# Patient Record
Sex: Female | Born: 1970 | Race: Black or African American | Hispanic: No | Marital: Single | Smoking: Never smoker
Health system: Southern US, Community
[De-identification: ages and names within clinical notes are randomized; demographics above are authoritative.]

## PROBLEM LIST (undated history)

## (undated) DIAGNOSIS — D869 Sarcoidosis, unspecified: Secondary | ICD-10-CM

## (undated) DIAGNOSIS — K219 Gastro-esophageal reflux disease without esophagitis: Secondary | ICD-10-CM

## (undated) DIAGNOSIS — R0600 Dyspnea, unspecified: Secondary | ICD-10-CM

## (undated) DIAGNOSIS — K30 Functional dyspepsia: Secondary | ICD-10-CM

## (undated) DIAGNOSIS — I00 Rheumatic fever without heart involvement: Secondary | ICD-10-CM

## (undated) HISTORY — PX: OTHER SURGICAL HISTORY: SHX169

## (undated) HISTORY — DX: Dyspnea, unspecified: R06.00

## (undated) HISTORY — DX: Functional dyspepsia: K30

## (undated) HISTORY — DX: Rheumatic fever without heart involvement: I00

## (undated) HISTORY — DX: Gastro-esophageal reflux disease without esophagitis: K21.9

## (undated) HISTORY — DX: Sarcoidosis, unspecified: D86.9

---

## 2012-05-01 LAB — HM COLONOSCOPY

## 2013-03-22 ENCOUNTER — Ambulatory Visit (INDEPENDENT_AMBULATORY_CARE_PROVIDER_SITE_OTHER): Payer: Commercial Managed Care - PPO | Admitting: Neurology

## 2013-03-22 ENCOUNTER — Encounter (INDEPENDENT_AMBULATORY_CARE_PROVIDER_SITE_OTHER): Payer: Self-pay

## 2013-03-22 ENCOUNTER — Encounter: Payer: Self-pay | Admitting: Neurology

## 2013-03-22 VITALS — BP 130/84 | HR 74 | Ht 67.0 in | Wt 188.0 lb

## 2013-03-22 DIAGNOSIS — R202 Paresthesia of skin: Secondary | ICD-10-CM | POA: Insufficient documentation

## 2013-03-22 DIAGNOSIS — R209 Unspecified disturbances of skin sensation: Secondary | ICD-10-CM

## 2013-03-22 DIAGNOSIS — D869 Sarcoidosis, unspecified: Secondary | ICD-10-CM | POA: Insufficient documentation

## 2013-03-22 NOTE — Progress Notes (Signed)
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, she had excessive weight gain, also complains of fatigue easily then     She was evaluated with MRI of lumbar, MRI of the pelvic, that was reported normal  Her symptoms persisted, she was evaluated by neurologist around 2013, reported normal EMG nerve conduction study,  Over the past few years, she also noticed her left leg seems to give out on her easily during her workout, mild weakness in her left arm as well  She denies significant low back pain, no gait difficulty, no right leg symptoms, no visual loss, no bilateral arm paresthesia weakness  Most recent laboratory evaluation showed normal CBC, TSH, CMP  REVIEW OF SYSTEMS: Full 14 system review of systems performed and notable only for left leg discomfort   ALLERGIES: No Known Allergies  HOME MEDICATIONS:  PAST MEDICAL HISTORY: History reviewed. No pertinent past medical history.  PAST SURGICAL HISTORY: Past Surgical History  Procedure Laterality Date  . None listed      FAMILY HISTORY: Family History  Problem Relation Age of Onset  . High blood pressure Father     SOCIAL HISTORY:  History   Social History  . Marital Status: Single    Spouse Name: N/A    Number of Children: 0  . 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 Smoking status: Never Smoker   . Smokeless tobacco: Never  Used  . Alcohol Use: No  . Drug Use: No  . Sexual Activity: Not on file   Other Topics Concern  . Not on file   Social History Narrative   Patient lives at home alone and she is single. Patient works full time for [employer].   Education college   Right handed   Caffeine one cup of coffee daily.     PHYSICAL EXAM   Filed Vitals:   03/22/13 0846  BP: 130/84  Pulse: 74  Height: 5\' 7"  (1.702 m)  Weight: 188 lb (85.276 kg)    Body mass index is 29.44 kg/(m^2).   Generalized: In no acute distress  Neck: Supple, no carotid bruits   Cardiac: Regular rate rhythm  Pulmonary: Clear to auscultation bilaterally  Musculoskeletal: No deformity  Neurological examination  Mentation: Alert oriented to time, place, history taking, and causual conversation  Cranial nerve II-XII: Pupils were equal round reactive to light. Extraocular movements were full.  Visual field were full on confrontational test. Bilateral fundi were sharp.  Facial sensation and strength were normal. Hearing was intact to finger rubbing bilaterally. Uvula tongue midline.  Head turning and shoulder shrug and were normal and symmetric.Tongue protrusion into cheek strength was normal.  Motor: Normal tone, bulk and strength.  Sensory: Intact to fine touch, pinprick, preserved vibratory sensation, and proprioception at toes.  Coordination: Normal finger to nose, heel-to-shin bilaterally there was no truncal ataxia  Gait: Rising up from seated position without assistance, normal stance, without  trunk ataxia, moderate stride, good arm swing, smooth turning, able to perform tiptoe, and heel walking without difficulty.   Romberg signs: Negative  Deep tendon reflexes: Brachioradialis 2/2, biceps 2/2, triceps 2/2, patellar 2/2, Achilles 2/2, plantar responses were flexor bilaterally.   DIAGNOSTIC DATA (LABS, IMAGING, TESTING) - I reviewed patient records, labs, notes, testing and imaging myself where  available.  ASSESSMENT AND PLAN  Babette Relicammy Leavy CellaBoyd is a 43 y.o. female complains of left leg discomfort, involving her left anterior thigh, extending below her left knee and left medial leg, essentially normal neurological examination, previous normal evaluation including EMG nerve conduction study, MRI of lumbar, MRI of pelvic,  1. potential localization of her complains also includes left cervical spine, vs. right hemisphere 2. MRI of cervical, brain with without contrast with her history of pulmonary sarcoidosis 3 I will call her report.          Levert FeinsteinYijun Darice Vicario, M.D. Ph.D.  [facility] [address], [address] [place], [place] [id number] [phone]

## 2013-03-29 ENCOUNTER — Other Ambulatory Visit: Payer: Commercial Managed Care - PPO

## 2013-04-19 ENCOUNTER — Other Ambulatory Visit: Payer: Commercial Managed Care - PPO

## 2013-05-10 ENCOUNTER — Encounter (INDEPENDENT_AMBULATORY_CARE_PROVIDER_SITE_OTHER): Payer: Self-pay

## 2013-05-10 ENCOUNTER — Ambulatory Visit (INDEPENDENT_AMBULATORY_CARE_PROVIDER_SITE_OTHER): Payer: Commercial Managed Care - PPO

## 2013-05-10 DIAGNOSIS — R202 Paresthesia of skin: Secondary | ICD-10-CM

## 2013-05-10 DIAGNOSIS — R209 Unspecified disturbances of skin sensation: Secondary | ICD-10-CM

## 2013-05-10 MED ORDER — GADOPENTETATE DIMEGLUMINE 469.01 MG/ML IV SOLN: 17.0000 mL | Freq: Once | INTRAVENOUS | Status: AC | PRN

## 2013-05-17 NOTE — Progress Notes (Signed)
Quick Note:  Spoke to patient and relayed normal MRI brain, and MRI cervical results, per Dr. Hosie PoissonSumner. Patient had difficulty getting on my chart and will contact [facility]. ______

## 2013-08-30 ENCOUNTER — Ambulatory Visit
Admission: RE | Admit: 2013-08-30 | Discharge: 2013-08-30 | Disposition: A | Discharge status: Home / Self Care | Payer: Commercial Managed Care - PPO | Source: Ambulatory Visit | Attending: Cardiology | Admitting: Cardiology

## 2013-08-30 ENCOUNTER — Other Ambulatory Visit: Payer: Self-pay | Admitting: Cardiology

## 2013-08-30 DIAGNOSIS — D869 Sarcoidosis, unspecified: Secondary | ICD-10-CM

## 2013-09-27 ENCOUNTER — Encounter (INDEPENDENT_AMBULATORY_CARE_PROVIDER_SITE_OTHER): Payer: Self-pay

## 2013-09-27 ENCOUNTER — Ambulatory Visit (INDEPENDENT_AMBULATORY_CARE_PROVIDER_SITE_OTHER): Payer: Commercial Managed Care - PPO | Admitting: Internal Medicine

## 2013-09-27 ENCOUNTER — Encounter: Payer: Self-pay | Admitting: Internal Medicine

## 2013-09-27 VITALS — BP 116/72 | HR 89 | Ht 67.5 in | Wt 181.0 lb

## 2013-09-27 DIAGNOSIS — R000 Tachycardia, unspecified: Secondary | ICD-10-CM

## 2013-09-27 DIAGNOSIS — D869 Sarcoidosis, unspecified: Secondary | ICD-10-CM

## 2013-09-27 NOTE — Progress Notes (Signed)
Subjective:    Patient ID: Carol French, female    DOB: 1970/06/18, 43 y.o.   MRN: [id number] PCP Gaye Alken, MD  HPI   IOV 09/27/2013  Chief Complaint  Patient presents with  . Pulmonary Consult    Referred by Dr. Nadara Eaton for dyspnea. Pt c/o dyspnea with activity and rest. Pt states she has PND and only clears throat d/t PND. Pt denies CP/tightness.     43 year old [place] American female. 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 Referred for evaluation of sarcoidosis.  Reports that in 2007 she was having chronic cough which then resulted in a diagnosis of pulmonary sarcoidosis early 2008 based on CT scan of the chest and bronchoscopy in [place]. She was then evaluated by pulmonologist to her history. She was placed on chronic prednisone dose unknown but she continued for a few years up until 2010/2011. Since then she has been doing relatively coffee particularly in the last one or 2 years she has been completely without cough and has not followed up with a pulmonologist. She is also normal residence in [place], [place].    In the interim, she does note that for several years she's had insidious onset of shortness of breath that happened at random. These episodes of a subjective sensation of dyspnea while watching TV that persists transiently and then resolves. She does not think much of this and on as he does not want to have a detailed evaluation for the same. She thinks this might be due to thickness tissue. There is no associated cough no wheezing, orthopnea, paroxysmal nocturnal dyspnea, edema.  Chest x-ray in mid July 2015 shows hyperinflation with chronic upper lobe pulmonary fibrosis all suggestive of burnt out sarcoidosis  The main new problem is one of tachycardia that started sometime around May 2015. She's had random episodes of tachycardia with heart rate of 150 per minute. There are related to caffeine intake. Then on her birthday on  08/26/2011 the tachycardia persisted all day and since then she's not had any tachycardia. Episodes can last for a few seconds to a few minutes. When she exercises she does not feel dyspnea or tachycardia. She was elevated by cardiologist. A echo report is attached and this is normal on 09/01/2013. She's had a Holter study done and apparently this showed some kind of a sinus arrhythmia. She is unsure of the details. She has a followup pending with her cardiologist Dr. Docia Barrier. But apparently cardiac sarcoidosis has been ruled out according to the history   Past Medical History  Diagnosis Date  . Sarcoidosis   . Dyspnea   . Indigestion      Family History  Problem Relation Age of Onset  . High blood pressure Father      History   Social History  . Marital Status: Unknown    Spouse Name: N/A    Number of Children: 0  . Years of Education: college   Occupational History  . 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 Smoking status: Never Smoker   . Smokeless tobacco: Never Used  . Alcohol Use: No  . Drug Use: No  . Sexual Activity: Not on file   Other Topics Concern  . Not on file   Social History Narrative   Patient lives at home alone and she is single. Patient works full time for [employer].   Education college   Right handed   Caffeine  one cup of coffee daily.     No Known Allergies   Outpatient Prescriptions Prior to Visit  Medication Sig Dispense Refill  . Omeprazole-Sodium Bicarbonate (ZEGERID PO) Take by mouth daily.       No facility-administered medications prior to visit.      Review of Systems  Constitutional: Negative for fever and unexpected weight change.  HENT: Positive for congestion and postnasal drip. Negative for dental problem, ear pain, nosebleeds, rhinorrhea, sinus pressure, sneezing, sore throat and trouble swallowing.   Eyes: Negative for redness and itching.  Respiratory: Positive for cough and shortness  of breath. Negative for chest tightness and wheezing.   Cardiovascular: Positive for palpitations. Negative for leg swelling.  Gastrointestinal: Negative for nausea and vomiting.  Genitourinary: Negative for dysuria.  Musculoskeletal: Negative for joint swelling.  Skin: Negative for rash.  Neurological: Negative for headaches.  Hematological: Does not bruise/bleed easily.  Psychiatric/Behavioral: Negative for dysphoric mood. The patient is not nervous/anxious.    Current outpatient prescriptions:Multiple Vitamin (MULTI-VITAMINS) TABS, Take 1 tablet by mouth daily., Disp: , Rfl: ;  Omeprazole-Sodium Bicarbonate (ZEGERID PO), Take by mouth daily., Disp: , Rfl:       Objective:   Physical Exam  Vitals reviewed. Constitutional: She is oriented to person, place, and time. She appears well-developed and well-nourished. No distress.  HENT:  Head: Normocephalic and atraumatic.  Right Ear: External ear normal.  Left Ear: External ear normal.  Mouth/Throat: Oropharynx is clear and moist. No oropharyngeal exudate.  Eyes: Conjunctivae and EOM are normal. Pupils are equal, round, and reactive to light. Right eye exhibits no discharge. Left eye exhibits no discharge. No scleral icterus.  Neck: Normal range of motion. Neck supple. No JVD present. No tracheal deviation present. No thyromegaly present.  Cardiovascular: Normal rate, regular rhythm, normal heart sounds and intact distal pulses.  Exam reveals no gallop and no friction rub.   No murmur heard. Pulmonary/Chest: Effort normal and breath sounds normal. No respiratory distress. She has no wheezes. She has no rales. She exhibits no tenderness.  Abdominal: Soft. Bowel sounds are normal. She exhibits no distension and no mass. There is no tenderness. There is no rebound and no guarding.  Musculoskeletal: Normal range of motion. She exhibits no edema and no tenderness.  Lymphadenopathy:    She has no cervical adenopathy.  Neurological: She is  alert and oriented to person, place, and time. She has normal reflexes. No cranial nerve deficit. She exhibits normal muscle tone. Coordination normal.  Skin: Skin is warm and dry. No rash noted. She is not diaphoretic. No erythema. No pallor.  Psychiatric: She has a normal mood and affect. Her behavior is normal. Judgment and thought content normal.   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 Only  Dr Leanna Battles or Dr. Trudie Reed to read)  - Please do PFT - If these are ok, given asymptomatic state observation treatment only  #Tachycardia  - Please discuss with Dr Jacinto Halim and ensure no evidence of cardiac sarcoidosis - IF any hint of cardiac sarcoid, AICD placement is recommended along with steroids  #[place] Will call you  with test results to ensure course of action

## 2013-09-27 NOTE — Patient Instructions (Addendum)
#  Pulmonary sarcoidosis  - likely burnt out  - asess current baseline:    - Please do Ct chest without contrast (  High Resolution CT chest without contrast on ILD protocol. Only  Dr Leanna Battles or Dr. Trudie Reed to read)  - Please do PFT - If these are ok, given asymptomatic state observation treatment only  #Tachycardia  - Please discuss with Dr Jacinto Halim and ensure no evidence of cardiac sarcoidosis - IF any hint of cardiac sarcoid, AICD placement is recommended along with steroids  #[place] Will call you with test results to ensure course of action

## 2013-09-29 NOTE — Assessment & Plan Note (Signed)
#  Pulmonary sarcoidosis  - likely burnt out  - asess current baseline:    - Please do Ct chest without contrast (  High Resolution CT chest without contrast on ILD protocol. Only  Dr Leanna Battles or Dr. Trudie Reed to read)  - Please do PFT - If these are ok, given asymptomatic state observation treatment only   #[place] Will call you with test results to ensure course of action

## 2013-09-29 NOTE — Assessment & Plan Note (Signed)
#  Tachycardia  - Please discuss with Dr Jacinto Halim and ensure no evidence of cardiac sarcoidosis - IF any hint of cardiac sarcoid, AICD placement is recommended along with steroids

## 2013-10-11 ENCOUNTER — Ambulatory Visit (INDEPENDENT_AMBULATORY_CARE_PROVIDER_SITE_OTHER)
Admission: RE | Admit: 2013-10-11 | Discharge: 2013-10-11 | Disposition: A | Discharge status: Home / Self Care | Payer: Commercial Managed Care - PPO | Source: Ambulatory Visit | Attending: Internal Medicine | Admitting: Internal Medicine

## 2013-10-11 ENCOUNTER — Telehealth: Payer: Self-pay | Admitting: Internal Medicine

## 2013-10-11 DIAGNOSIS — D869 Sarcoidosis, unspecified: Secondary | ICD-10-CM

## 2013-10-11 NOTE — Telephone Encounter (Signed)
  CGT just shows burned out sarcoid on the apex of lung. Will await PFT results 9/11/5. I left messsage saying that I will call her next week once PFT done but she can call 10/11/2013 if she wanted   Ct Chest High Resolution  10/11/2013   CLINICAL DATA:  Sarcoidosis.  EXAM: CT CHEST WITHOUT CONTRAST  TECHNIQUE: Multidetector CT imaging of the chest was performed following the standard protocol without intravenous contrast. High resolution imaging of the lungs, as well as inspiratory and expiratory imaging, was performed.  COMPARISON:  No prior chest CTs.  Chest x-ray 08/30/2013.  FINDINGS: Mediastinum: Heart size is normal. There is no significant pericardial fluid, thickening or pericardial calcification. No pathologically enlarged mediastinal or hilar lymph nodes. Please note that accurate exclusion of hilar adenopathy is limited on noncontrast CT scans. Esophagus is unremarkable in appearance.  Lungs/Pleura: There is an upper lung predominant pattern of cylindrical and varicose bronchiectasis with associated architectural distortion and volume loss which results and upward retraction of hilar structures. Diffuse thickening of the peribronchovascular interstitium is noted throughout the lungs bilaterally, again upper lung predominant. Mild diffuse peribronchovascular and subpleural nodularity (i.e., a perilymphatic distribution) is noted. No larger more suspicious appearing pulmonary nodules or masses. No acute consolidative airspace disease. No pleural effusions. Inspiratory and expiratory imaging is unremarkable.  Upper Abdomen: Unremarkable.  Musculoskeletal: There are no aggressive appearing lytic or blastic lesions noted in the visualized portions of the skeleton.  IMPRESSION: 1. Upper lung predominant perilymphatic distribution of chronic lung changes with some associated upper lung predominant cylindrical and varicose bronchiectasis, as discussed above, is compatible with the reported clinical history of  sarcoidosis. 2. No mediastinal or hilar lymphadenopathy noted at this time. 3. No acute findings.   Electronically Signed   By: Trudie Reed M.D.   On: 10/11/2013 09:31

## 2013-10-12 ENCOUNTER — Ambulatory Visit (INDEPENDENT_AMBULATORY_CARE_PROVIDER_SITE_OTHER): Payer: Commercial Managed Care - PPO | Admitting: Internal Medicine

## 2013-10-12 DIAGNOSIS — D869 Sarcoidosis, unspecified: Secondary | ICD-10-CM

## 2013-10-12 LAB — PULMONARY FUNCTION TEST
DL/VA % pred: 91 %
DL/VA: 4.73 ml/min/mmHg/L
DLCO unc % pred: 73 %
DLCO unc: 20.82 ml/min/mmHg
FEF 25-75 Post: 4.5 L/s
FEF 25-75 Pre: 4 L/s
FEF2575-%Change-Post: 12 %
FEF2575-%Pred-Post: 137 %
FEF2575-%Pred-Pre: 122 %
FEV1-%Change-Post: 1 %
FEV1-%Pred-Post: 99 %
FEV1-%Pred-Pre: 98 %
FEV1-Post: 3.29 L
FEV1-Pre: 3.23 L
FEV1FVC-%Change-Post: 1 %
FEV1FVC-%Pred-Pre: 109 %
FEV6-%Change-Post: 0 %
FEV6-%Pred-Post: 90 %
FEV6-%Pred-Pre: 89 %
FEV6-Post: 3.6 L
FEV6-Pre: 3.57 L
FEV6FVC-%Change-Post: 0 %
FEV6FVC-%Pred-Post: 102 %
FEV6FVC-%Pred-Pre: 101 %
FVC-%Change-Post: 0 %
FVC-%Pred-Post: 88 %
FVC-%Pred-Pre: 88 %
FVC-Post: 3.6 L
FVC-Pre: 3.58 L
Post FEV1/FVC ratio: 91 %
Post FEV6/FVC ratio: 100 %
Pre FEV1/FVC ratio: 90 %
Pre FEV6/FVC Ratio: 100 %
RV % pred: 67 %
RV: 1.19 L
TLC % pred: 83 %
TLC: 4.58 L

## 2013-10-12 NOTE — Progress Notes (Signed)
PFT done today. 

## 2013-10-16 NOTE — Telephone Encounter (Signed)
Called and spoke to pt. Informed pt of the results and recs per MR. Pt verbalize understanding and denied any further questions or concerns at this time.

## 2013-12-02 NOTE — Telephone Encounter (Signed)
Let her know PFT 10/12/13 is normal except mild reduction in diffusion which reflects the burnt out sarcoid. My apologies for getting back to her late. Please give fu in 1 year from 12/02/2013   Dr. Kalman ShanMurali Kaye Mitro, M.D., [facility]P Pulmonary and Critical Care Medicine Staff Physician [facility] Pulmonary and Critical Care Pager: [phone], If no answer or between  15:00h - 7:00h: call [phone]  12/02/2013 8:11 PM

## 2013-12-04 NOTE — Telephone Encounter (Signed)
lmtcb

## 2013-12-04 NOTE — Telephone Encounter (Signed)
Pt returned call. Informed pt of the results and recs per MR. 1 year recall placed. Pt verbalized understanding and denied any further questions or concerns at this time.

## 2015-01-21 ENCOUNTER — Other Ambulatory Visit: Payer: Self-pay | Admitting: Obstetrics and Gynecology

## 2015-01-21 DIAGNOSIS — R928 Other abnormal and inconclusive findings on diagnostic imaging of breast: Secondary | ICD-10-CM

## 2015-01-24 ENCOUNTER — Ambulatory Visit
Admission: RE | Admit: 2015-01-24 | Discharge: 2015-01-24 | Disposition: A | Discharge status: Home / Self Care | Payer: Commercial Managed Care - PPO | Source: Ambulatory Visit | Attending: Obstetrics and Gynecology | Admitting: Obstetrics and Gynecology

## 2015-01-24 DIAGNOSIS — R928 Other abnormal and inconclusive findings on diagnostic imaging of breast: Secondary | ICD-10-CM

## 2015-02-13 ENCOUNTER — Ambulatory Visit (INDEPENDENT_AMBULATORY_CARE_PROVIDER_SITE_OTHER): Payer: Commercial Managed Care - PPO | Admitting: Internal Medicine

## 2015-02-13 ENCOUNTER — Encounter: Payer: Self-pay | Admitting: Internal Medicine

## 2015-02-13 VITALS — BP 120/78 | HR 86 | Ht 67.5 in | Wt 185.0 lb

## 2015-02-13 DIAGNOSIS — D869 Sarcoidosis, unspecified: Secondary | ICD-10-CM | POA: Diagnosis not present

## 2015-02-13 NOTE — Patient Instructions (Signed)
ICD-9-CM ICD-10-CM   1. Sarcoidosis (HCC) 135 D86.9    Burn doubt sarcoidosis of the lung Glad you're doing well and asymptomatic Pay attention to renal ocular, pulmonary, skin issues as  life progresses  - Glad you seeing a dermatologist for a lip lesion history Return again have any concerns otherwise open-ended follow-up

## 2015-02-13 NOTE — Progress Notes (Signed)
Subjective:     Patient ID: Carol French, female   DOB: Aug 16, 1970, 45 y.o.   MRN: [id number]  HPI    IOV 09/27/2013  Chief Complaint  Patient presents with  . Pulmonary Consult    Referred by Dr. Nadara EatonGangi for dyspnea. Pt c/o dyspnea with activity and rest. Pt states she has PND and only clears throat d/t PND. Pt denies CP/tightness.     45 year old [place] female. 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 Referred for evaluation of sarcoidosis.  Reports that in 2007 she was having chronic cough which then resulted in a diagnosis of pulmonary sarcoidosis early 2008 based on CT scan of the chest and bronchoscopy in [place]. She was then evaluated by pulmonologist to her history. She was placed on chronic prednisone dose unknown but she continued for a few years up until 2010/2011. Since then she has been doing relatively coffee particularly in the last one or 2 years she has been completely without cough and has not followed up with a pulmonologist. She is also normal [place] in [place], [place].    In the interim, she does note that for several years she's had insidious onset of shortness of breath that happened at random. These episodes of a subjective sensation of dyspnea while watching TV that persists transiently and then resolves. She does not think much of this and on as he does not want to have a detailed evaluation for the same. She thinks this might be due to thickness tissue. There is no associated cough no wheezing, orthopnea, paroxysmal nocturnal dyspnea, edema.  Chest x-ray in mid July 2015 shows hyperinflation with chronic upper lobe pulmonary fibrosis all suggestive of burnt out sarcoidosis  The main new problem is one of tachycardia that started sometime around May 2015. She's had random episodes of tachycardia with heart rate of 150 per minute. There are related to caffeine intake. Then on her birthday on 08/26/2011 the tachycardia persisted  all day and since then she's not had any tachycardia. Episodes can last for a few seconds to a few minutes. When she exercises she does not feel dyspnea or tachycardia. She was elevated by cardiologist. A echo report is attached and this is normal on 09/01/2013. She's had a Holter study done and apparently this showed some kind of a sinus arrhythmia. She is unsure of the details. She has a followup pending with her cardiologist Dr. Docia BarrierGanje. But apparently cardiac sarcoidosis has been ruled out according to the history    OV 02/13/2015  Chief Complaint  Patient presents with  . Follow-up    Pt here for 1 year f/u. Pt states her breathing is doing well. Pt denies SOB, cough, CP/tightness.    Follow-up sarcoidosis.  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 It has been slightly over one year but this is an annual follow-up. We did a pulmonary function test which was normal other than isolated diffusion capacity reduction. CT chest in the end of 2015 showed burnt out sarcoid. There is some concern about tachycardia but patient says this is resolved. She is essentially asymptomatic. Recently she had some mild occasionally recurrent lip lesions and she is trying to see a dermatologist. Given the fact she is doing well she wants to have an open area follow-up plan with us. Review of Systems     Objective:   Physical Exam  Constitutional: She is oriented to person, place, and time. She appears well-developed and well-nourished. No distress.  HENT:  Head:  Normocephalic and atraumatic.  Right Ear: External ear normal.  Left Ear: External ear normal.  Mouth/Throat: Oropharynx is clear and moist. No oropharyngeal exudate.  Eyes: Conjunctivae and EOM are normal. Pupils are equal, round, and reactive to light. Right eye exhibits no discharge. Left eye exhibits no discharge. No scleral icterus.  Neck: Normal range of motion. Neck supple. No JVD present. No tracheal deviation present. No thyromegaly present.   Cardiovascular: Normal rate, regular rhythm, normal heart sounds and intact distal pulses.  Exam reveals no gallop and no friction rub.   No murmur heard. Pulmonary/Chest: Effort normal and breath sounds normal. No respiratory distress. She has no wheezes. She has no rales. She exhibits no tenderness.  Abdominal: Soft. Bowel sounds are normal. She exhibits no distension and no mass. There is no tenderness. There is no rebound and no guarding.  Musculoskeletal: Normal range of motion. She exhibits no edema or tenderness.  Lymphadenopathy:    She has no cervical adenopathy.  Neurological: She is alert and oriented to person, place, and time. She has normal reflexes. No cranial nerve deficit. She exhibits normal muscle tone. Coordination normal.  Skin: Skin is warm and dry. No rash noted. She is not diaphoretic. No erythema. No pallor.  Psychiatric: She has a normal mood and affect. Her behavior is normal. Judgment and thought content normal.  Vitals reviewed.   Filed Vitals:   02/13/15 1402  BP: 120/78  Pulse: 86  Height: 5' 7.5" (1.715 m)  Weight: 185 lb (83.915 kg)  SpO2: 99%        Assessment:       ICD-9-CM ICD-10-CM   1. Sarcoidosis (HCC) 135 D86.9       Plan:     Burn doubt sarcoidosis of the lung Glad you're doing well and asymptomatic Pay attention to renal ocular, pulmonary, skin issues as  life progresses  - Glad you seeing a dermatologist for a lip lesion history Return again have any concerns otherwise open-ended follow-up   Dr. Kalman Shan, M.D., [facility].C.P Pulmonary and Critical Care Medicine Staff Physician [facility] Pulmonary and Critical Care Pager: [phone], If no answer or between  15:00h - 7:00h: call [phone]  02/13/2015 2:12 PM

## 2015-06-23 ENCOUNTER — Other Ambulatory Visit: Payer: Self-pay | Admitting: Obstetrics and Gynecology

## 2015-06-23 DIAGNOSIS — N6489 Other specified disorders of breast: Secondary | ICD-10-CM

## 2015-07-31 ENCOUNTER — Ambulatory Visit
Admission: RE | Admit: 2015-07-31 | Discharge: 2015-07-31 | Disposition: A | Discharge status: Home / Self Care | Payer: Commercial Managed Care - PPO | Source: Ambulatory Visit | Attending: Obstetrics and Gynecology | Admitting: Obstetrics and Gynecology

## 2015-07-31 DIAGNOSIS — N6489 Other specified disorders of breast: Secondary | ICD-10-CM

## 2016-04-13 ENCOUNTER — Other Ambulatory Visit: Payer: Self-pay | Admitting: Obstetrics and Gynecology

## 2016-04-13 DIAGNOSIS — N6489 Other specified disorders of breast: Secondary | ICD-10-CM

## 2016-04-22 ENCOUNTER — Ambulatory Visit
Admission: RE | Admit: 2016-04-22 | Discharge: 2016-04-22 | Disposition: A | Discharge status: Home / Self Care | Payer: Commercial Managed Care - PPO | Source: Ambulatory Visit | Attending: Obstetrics and Gynecology | Admitting: Obstetrics and Gynecology

## 2016-04-22 DIAGNOSIS — N6489 Other specified disorders of breast: Secondary | ICD-10-CM

## 2017-06-20 ENCOUNTER — Other Ambulatory Visit: Payer: Self-pay | Admitting: Obstetrics and Gynecology

## 2017-06-20 DIAGNOSIS — Z1231 Encounter for screening mammogram for malignant neoplasm of breast: Secondary | ICD-10-CM

## 2017-07-21 ENCOUNTER — Ambulatory Visit
Admission: RE | Admit: 2017-07-21 | Discharge: 2017-07-21 | Disposition: A | Discharge status: Home / Self Care | Payer: Commercial Managed Care - PPO | Source: Ambulatory Visit | Attending: Obstetrics and Gynecology | Admitting: Obstetrics and Gynecology

## 2017-07-21 DIAGNOSIS — Z1231 Encounter for screening mammogram for malignant neoplasm of breast: Secondary | ICD-10-CM

## 2017-08-12 ENCOUNTER — Ambulatory Visit (INDEPENDENT_AMBULATORY_CARE_PROVIDER_SITE_OTHER): Payer: PRIVATE HEALTH INSURANCE | Admitting: Family Medicine

## 2017-08-12 ENCOUNTER — Encounter: Payer: Self-pay | Admitting: Family Medicine

## 2017-08-12 VITALS — BP 136/70 | HR 96 | Temp 98.9°F | Ht 67.0 in | Wt 189.0 lb

## 2017-08-12 DIAGNOSIS — K5900 Constipation, unspecified: Secondary | ICD-10-CM

## 2017-08-12 DIAGNOSIS — Z7689 Persons encountering health services in other specified circumstances: Secondary | ICD-10-CM | POA: Diagnosis not present

## 2017-08-12 DIAGNOSIS — R5383 Other fatigue: Secondary | ICD-10-CM | POA: Diagnosis not present

## 2017-08-12 DIAGNOSIS — L659 Nonscarring hair loss, unspecified: Secondary | ICD-10-CM | POA: Insufficient documentation

## 2017-08-12 DIAGNOSIS — D869 Sarcoidosis, unspecified: Secondary | ICD-10-CM | POA: Diagnosis not present

## 2017-08-12 LAB — T4, FREE: Free T4: 0.77 ng/dL (ref 0.60–1.60)

## 2017-08-12 LAB — CBC WITH DIFFERENTIAL/PLATELET
BASOS PCT: 1.3 % (ref 0.0–3.0)
Basophils Absolute: 0 10*3/uL (ref 0.0–0.1)
EOS PCT: 1.3 % (ref 0.0–5.0)
Eosinophils Absolute: 0 10*3/uL (ref 0.0–0.7)
HCT: 36.6 % (ref 36.0–46.0)
HEMOGLOBIN: 11.9 g/dL — AB (ref 12.0–15.0)
Lymphocytes Relative: 39.2 % (ref 12.0–46.0)
Lymphs Abs: 1.3 10*3/uL (ref 0.7–4.0)
MCHC: 32.4 g/dL (ref 30.0–36.0)
MCV: 85.8 fl (ref 78.0–100.0)
MONO ABS: 0.4 10*3/uL (ref 0.1–1.0)
MONOS PCT: 13 % — AB (ref 3.0–12.0)
Neutro Abs: 1.4 10*3/uL (ref 1.4–7.7)
Neutrophils Relative %: 45.2 % (ref 43.0–77.0)
Platelets: 255 10*3/uL (ref 150.0–400.0)
RBC: 4.26 Mil/uL (ref 3.87–5.11)
RDW: 14.5 % (ref 11.5–15.5)
WBC: 3.2 10*3/uL — AB (ref 4.0–10.5)

## 2017-08-12 LAB — COMPREHENSIVE METABOLIC PANEL
ALBUMIN: 4.1 g/dL (ref 3.5–5.2)
ALT: 10 U/L (ref 0–35)
AST: 13 U/L (ref 0–37)
Alkaline Phosphatase: 59 U/L (ref 39–117)
BILIRUBIN TOTAL: 0.4 mg/dL (ref 0.2–1.2)
BUN: 10 mg/dL (ref 6–23)
CALCIUM: 9.5 mg/dL (ref 8.4–10.5)
CO2: 28 mEq/L (ref 19–32)
CREATININE: 0.86 mg/dL (ref 0.40–1.20)
Chloride: 103 mEq/L (ref 96–112)
GFR: 91.78 mL/min (ref >60.00)
Glucose, Bld: 110 mg/dL — ABNORMAL HIGH (ref 70–99)
Potassium: 4.4 mEq/L (ref 3.5–5.1)
Sodium: 137 mEq/L (ref 135–145)
Total Protein: 7.7 g/dL (ref 6.0–8.3)

## 2017-08-12 LAB — TSH: TSH: 1.07 u[IU]/mL (ref 0.35–4.50)

## 2017-08-12 LAB — VITAMIN D 25 HYDROXY (VIT D DEFICIENCY, FRACTURES): VITD: 11.97 ng/mL — AB (ref 30.00–100.00)

## 2017-08-12 NOTE — Patient Instructions (Signed)
Constipation, Adult Constipation is when a person has fewer bowel movements in a week than normal, has difficulty having a bowel movement, or has stools that are dry, hard, or larger than normal. Constipation may be caused by an underlying condition. It may become worse with age if a person takes certain medicines and does not take in enough fluids. Follow these instructions at home: Eating and drinking   Eat foods that have a lot of fiber, such as fresh fruits and vegetables, whole grains, and beans.  Limit foods that are high in fat, low in fiber, or overly processed, such as french fries, hamburgers, cookies, candies, and soda.  Drink enough fluid to keep your urine clear or pale yellow. General instructions  Exercise regularly or as told by your health care provider.  Go to the restroom when you have the urge to go. Do not hold it in.  Take over-the-counter and prescription medicines only as told by your health care provider. These include any fiber supplements.  Practice pelvic floor retraining exercises, such as deep breathing while relaxing the lower abdomen and pelvic floor relaxation during bowel movements.  Watch your condition for any changes.  Keep all follow-up visits as told by your health care provider. This is important. Contact a health care provider if:  You have pain that gets worse.  You have a fever.  You do not have a bowel movement after 4 days.  You vomit.  You are not hungry.  You lose weight.  You are bleeding from the anus.  You have thin, pencil-like stools. Get help right away if:  You have a fever and your symptoms suddenly get worse.  You leak stool or have blood in your stool.  Your abdomen is bloated.  You have severe pain in your abdomen.  You feel dizzy or you faint. This information is not intended to replace advice given to you by your health care provider. Make sure you discuss any questions you have with your health care  provider. Document Released: 10/17/2003 Document Revised: 08/08/2015 Document Reviewed: 07/09/2015 Elsevier Interactive Patient Education  2018 [employer].  Fatigue Fatigue is feeling tired all of the time, a lack of energy, or a lack of motivation. Occasional or mild fatigue is often a normal response to activity or life in general. However, long-lasting (chronic) or extreme fatigue may indicate an underlying medical condition. Follow these instructions at home: Watch your fatigue for any changes. The following actions may help to lessen any discomfort you are feeling:  Talk to your health care provider about how much sleep you need each night. Try to get the required amount every night.  Take medicines only as directed by your health care provider.  Eat a healthy and nutritious diet. Ask your health care provider if you need help changing your diet.  Drink enough fluid to keep your urine clear or pale yellow.  Practice ways of relaxing, such as yoga, meditation, massage therapy, or acupuncture.  Exercise regularly.  Change situations that cause you stress. Try to keep your work and personal routine reasonable.  Do not abuse illegal drugs.  Limit alcohol intake to no more than 1 drink per day for nonpregnant women and 2 drinks per day for men. One drink equals 12 ounces of beer, 5 ounces of wine, or 1 ounces of hard liquor.  Take a multivitamin, if directed by your health care provider.  Contact a health care provider if:  Your fatigue does not get better.  You have a fever.  You have unintentional weight loss or gain.  You have headaches.  You have difficulty: ? Falling asleep. ? Sleeping throughout the night.  You feel angry, guilty, anxious, or sad.  You are unable to have a bowel movement (constipation).  You skin is dry.  Your legs or another part of your body is swollen. Get help right away if:  You feel confused.  Your vision is blurry.  You feel  faint or pass out.  You have a severe headache.  You have severe abdominal, pelvic, or back pain.  You have chest pain, shortness of breath, or an irregular or fast heartbeat.  You are unable to urinate or you urinate less than normal.  You develop abnormal bleeding, such as bleeding from the rectum, vagina, nose, lungs, or nipples.  You vomit blood.  You have thoughts about harming yourself or committing suicide.  You are worried that you might harm someone else. This information is not intended to replace advice given to you by your health care provider. Make sure you discuss any questions you have with your health care provider. Document Released: 11/15/2006 Document Revised: 06/26/2015 Document Reviewed: 05/22/2013 Elsevier Interactive Patient Education  2018 [employer]. Sarcoidosis Sarcoidosis is a disease that causes inflammation in your organs and other areas of your body. The lungs are most often affected (pulmonary sarcoidosis). Sarcoidosis can also affect your lymph nodes, liver, eyes, skin, or any other body tissue. When you have sarcoidosis, small clumps of tissue (granulomas) form in the affected area of your body. Granulomas are made up of your body's defense (immune) cells. Inflammation results when your body reacts to a harmful substance. Normally, inflammation goes away after immune cells get rid of the harmful substance. In sarcoidosis, the immune cells form granulomas instead. What are the causes? The exact cause of sarcoidosis is not known. Something triggers the immune system to respond, such as dust, chemicals, bacteria, or a virus. What increases the risk? You may be at a greater risk for sarcoidosis if you:  Have a family history of the disease.  Are African American.  Are of Northern European ancestry.  Are 15-54 years old.  Are female.  What are the signs or symptoms? Many people with sarcoidosis have no symptoms. Others have very mild symptoms.  Sarcoidosis most often affects the lungs. Symptoms include:  Chest pain.  Coughing.  Wheezing.  Shortness of breath.  Other common symptoms include:  Night sweats.  Weight loss.  Fatigue.  Depression.  A sense of uneasiness.  How is this diagnosed? Sarcoidosis may be diagnosed by:  Medical history and physical exam.  Chest X-ray. This looks for granulomas in your lungs.  Lung function tests. These measure your breathing and look for problems related to sarcoidosis.  Examining a sample of tissue under a microscope (biopsy).  How is this treated? Sarcoidosis usually clears up without treatment. You may take medicines to reduce inflammation or relieve symptoms. These may include:  Prednisone. This steroid reduces inflammation related to sarcoidosis.  Chloroquine or hydroxychloroquine. These are antimalarial medicines used to treat sarcoidosis that affects the skin or brain.  Methotrexate, leflunomide, or azathioprine. These medicines affect the immune system and can help with sarcoidosis in the joints, eyes, skin, or lungs.  Inhalers. Inhaled medicines can help you breathe if sarcoidosis is affecting your lungs.  Follow these instructions at home:  Do not use any tobacco products, including cigarettes, chewing tobacco, or electronic cigarettes. If you need help quitting,  ask your health care provider.  Avoid secondhand smoke.  Avoid irritating dust and chemicals. Stay indoors on days when air quality is poor in your area.  Take medicines only as directed by your health care provider. Contact a health care provider if:  You have vision problems.  You have shortness of breath.  You have a dry, persistent cough.  You have an irregular heartbeat.  You have pain or ache in your joints, hands, or feet.  You have an unexplained rash. Get help right away if: You have chest pain. This information is not intended to replace advice given to you by your health  care provider. Make sure you discuss any questions you have with your health care provider. Document Released: 11/19/2003 Document Revised: 06/26/2015 Document Reviewed: 05/16/2013 Elsevier Interactive Patient Education  [employer]2018 Else[employer] Inc.

## 2017-08-12 NOTE — Progress Notes (Signed)
Patient presents to clinic today to establish care.  SUBJECTIVE: PMH: Pt is a 47 yo female with pmh sig for sarcoidosis and history of constipation.  Patient was previously seen by Juluis Rainier, MD at [facility].  Constipation: -Ongoing issue.  Recent episode started in February -Patient's been taking fiber supplements 3 times daily, MiraLAX several times per day, and increasing water to approximately 100 ounces per day. -Pt endorses small-volume stools that are shredded/flat in appearance.  Now having more watery stools. -Patient denies abdominal cramping, bloating, blood or dark-colored stools. -Patient now eating 1 meal per day 2/2 feeling constipated. -May have a smoothie with protein powder, blueberries, olive oil for breakfast and dinner.  May have a salad of mixed greens and egg or soup for lunch. -had a colonoscopy several yrs ago in [place] as was having maroon colored stools. -endorses feeling fatigued.  Sarcoidosis: -dx'd in 2007. -initial symptom was a cough -was on prednisone x a few yrs. Not taking anything currenlty -has seen Rheumatology in the past, Dr. Bedelia Person    Past Medical History:  Diagnosis Date  . Dyspnea   . GERD (gastroesophageal reflux disease)   . Indigestion   . Rheumatic fever   . Sarcoidosis     Past Surgical History:  Procedure Laterality Date  . None Listed      Current Outpatient Medications on File Prior to Visit  Medication Sig Dispense Refill  . Multiple Vitamin (MULTI-VITAMINS) TABS Take 1 tablet by mouth daily.    Maxwell Caul Bicarbonate (ZEGERID PO) Take by mouth daily as needed.     . ranitidine (ZANTAC) 150 MG tablet Take 150 mg by mouth as needed for heartburn.     No current facility-administered medications on file prior to visit.     No Known Allergies  Family History  Problem Relation Age of Onset  . Hypertension Mother   . Hyperlipidemia Mother   . High blood pressure Father   . Diabetes  Brother   . Cancer Maternal Grandfather     Social History   Socioeconomic History  . Marital status: Unknown    Spouse name: Not on file  . Number of children: 0  . Years of education: college  . Highest education level: Not on file  Occupational History  . 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 Financial resource strain: Not on file  . Food insecurity:    Worry: Not on file    Inability: Not on file  . Transportation needs:    Medical: Not on file    Non-medical: Not on file  Tobacco Use  . Smoking status: Never Smoker  . Smokeless tobacco: Never Used  Substance and Sexual Activity  . Alcohol use: No  . Drug use: No  . Sexual activity: Not Currently  Lifestyle  . Physical activity:    Days per week: Not on file    Minutes per session: Not on file  . Stress: Not on file  Relationships  . Social connections:    Talks on phone: Not on file    Gets together: Not on file    Attends religious service: Not on file    Active member of club or organization: Not on file    Attends meetings of clubs or organizations: Not on file    Relationship status: Not on file  . Intimate partner violence:    Fear of current or ex partner: Not on file  Emotionally abused: Not on file    Physically abused: Not on file    Forced sexual activity: Not on file  Other Topics Concern  . Not on file  Social History Narrative   Patient lives at home alone and she is single. Patient works full time for [employer].   Education college   Right handed   Caffeine one cup of coffee daily.    ROS General: Denies fever, chills, night sweats, changes in weight, changes in appetite  + fatigue, hair loss HEENT: Denies headaches, ear pain, changes in vision, rhinorrhea, sore throat CV: Denies CP, palpitations, SOB, orthopnea Pulm: Denies SOB, cough, wheezing GI: Denies abdominal pain, nausea, vomiting, diarrhea  + constipation GU: Denies dysuria,  hematuria, frequency, vaginal discharge Msk: Denies muscle cramps, joint pains Neuro: Denies weakness, numbness, tingling Skin: Denies rashes, bruising Psych: Denies depression, anxiety, hallucinations  BP 136/70 (BP Location: Left Arm, Patient Position: Sitting, Cuff Size: Large)   Pulse 96   Temp 98.9 F (37.2 C) (Oral)   Ht 5\' 7"  (1.702 m)   Wt 189 lb (85.7 kg)   LMP 08/03/2017 (Exact Date)   SpO2 99%   BMI 29.60 kg/m   Physical Exam Gen. Pleasant, well developed, well-nourished, in NAD HEENT - [place]/AT, PERRL, no scleral icterus, no nasal drainage, pharynx without erythema or exudate. Lungs: no use of accessory muscles, CTAB, no wheezes, rales or rhonchi Cardiovascular: RRR, no r/g/m, no peripheral edema Abdomen: BS present, soft, nontender, nondistended, no hepatosplenomegaly Neuro:  A&Ox3, CN II-XII intact, normal gait Skin:  Warm, dry, intact, no lesions  No results found for this or any previous visit (from the past 2160 hour(s)).  Assessment/Plan: Constipation, unspecified constipation type  -continue to increase po intake of fluids -ok to use miralax prn - Plan: CBC with Differential/Platelet, TSH, T4, Free, Comprehensive metabolic panel, Ambulatory referral to Gastroenterology  Hair loss  - Plan: TSH, T4, Free  Lethargy  -Given handout - Plan: Vitamin D, 25-hydroxy  Sarcoidosis -Stable  Encounter to establish care -We reviewed the PMH, PSH, FH, SH, Meds and Allergies. -We provided refills for any medications we will prescribe as needed. -We addressed current concerns per orders and patient instructions. -We have asked for records for pertinent exams, studies, vaccines and notes from previous providers. -We have advised patient to follow up per instructions below.   F/u prn  Abbe AmsterdamShannon Chlora Mcbain, MD

## 2017-08-19 ENCOUNTER — Encounter: Payer: Self-pay | Admitting: Gastroenterology

## 2017-08-19 ENCOUNTER — Telehealth: Payer: Self-pay | Admitting: Family Medicine

## 2017-08-19 ENCOUNTER — Other Ambulatory Visit: Payer: Self-pay | Admitting: Family Medicine

## 2017-08-19 DIAGNOSIS — E559 Vitamin D deficiency, unspecified: Secondary | ICD-10-CM

## 2017-08-19 MED ORDER — VITAMIN D (ERGOCALCIFEROL) 1.25 MG (50000 UNIT) PO CAPS: 50000.0000 [IU] | ORAL_CAPSULE | ORAL | 0 refills | Status: AC

## 2017-08-19 NOTE — Telephone Encounter (Signed)
Copied from CRM [phone]. Topic: Quick Communication - Lab Results >> Aug 19, 2017  4:53 PM Johnella MoloneyFunderburk, Jo A, CMA wrote: Called patient to inform them of lab results. When patient returns call, triage nurse may disclose results.

## 2017-08-23 NOTE — Telephone Encounter (Signed)
Patient notified

## 2017-10-17 ENCOUNTER — Ambulatory Visit: Payer: PRIVATE HEALTH INSURANCE | Admitting: Gastroenterology

## 2020-01-11 IMAGING — MG DIGITAL SCREENING BILATERAL MAMMOGRAM WITH TOMO AND CAD
8 series · 8 of 24 positions shown · non-contrast
Comparison: Previous exam(s).

CLINICAL DATA: Screening.

EXAM:
DIGITAL SCREENING BILATERAL MAMMOGRAM WITH TOMO AND CAD

[L CC synth-2D]
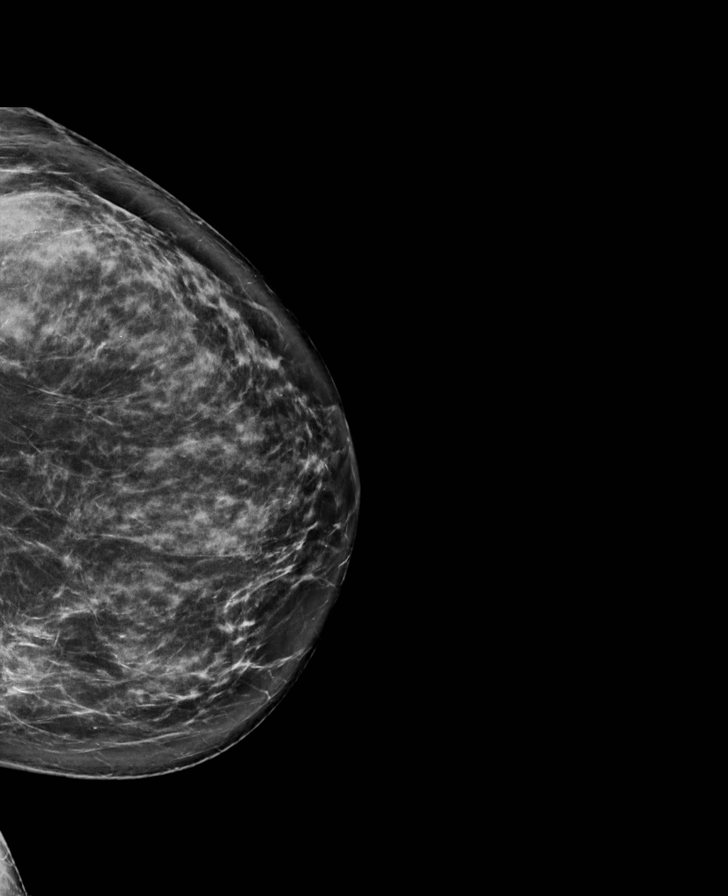

[R CC synth-2D]
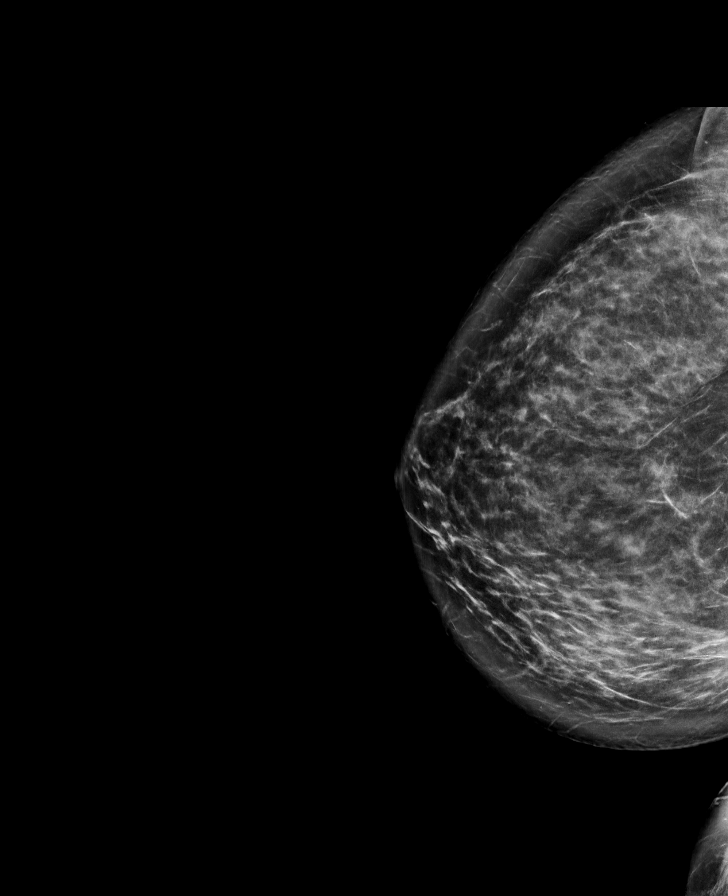

[R MLO synth-2D]
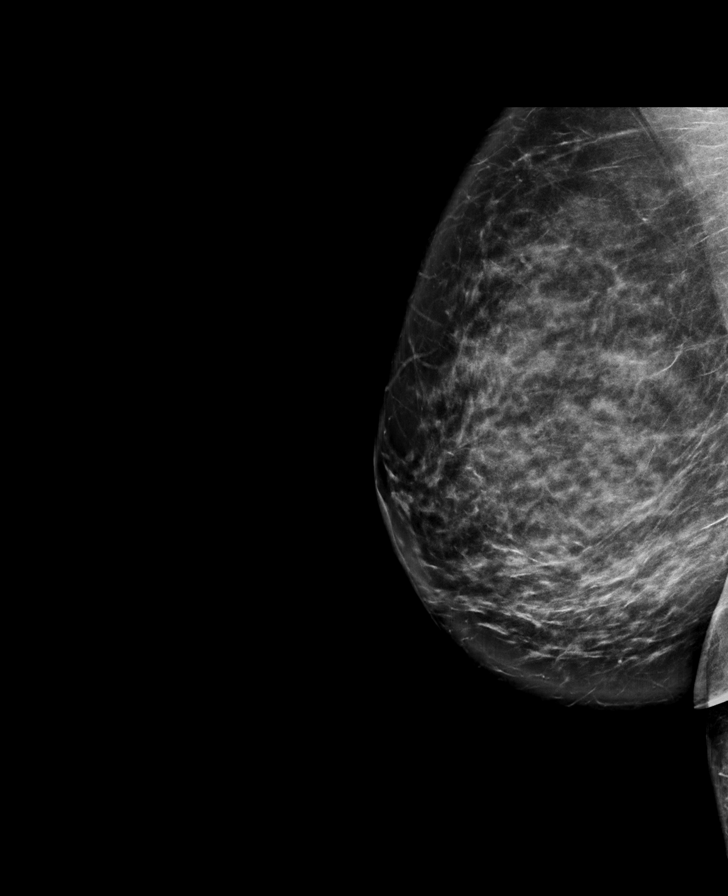

[L MLO synth-2D]
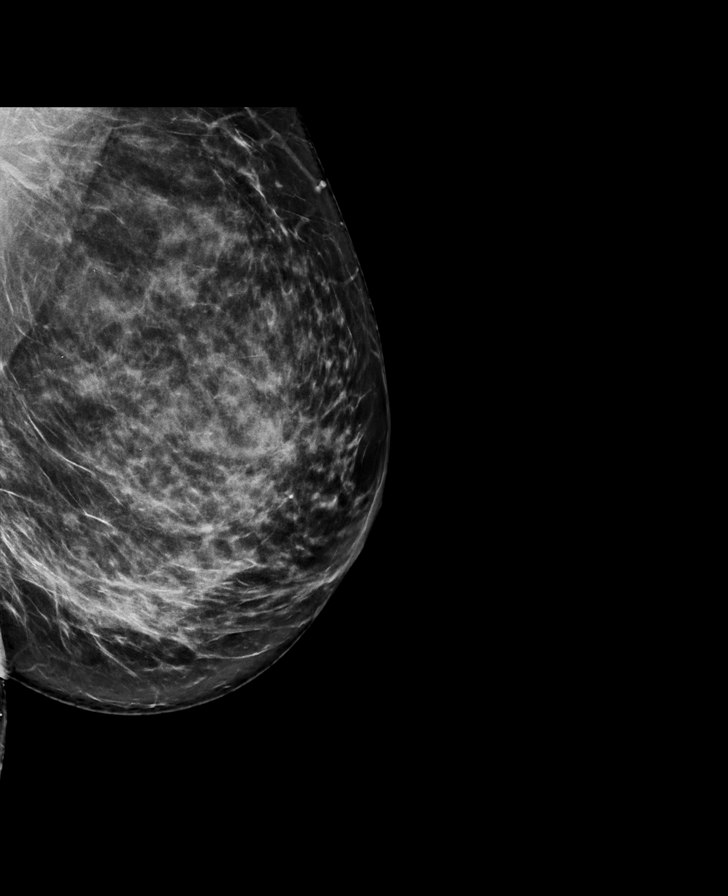

[L CC tomo · tomo slice 47/92.0]
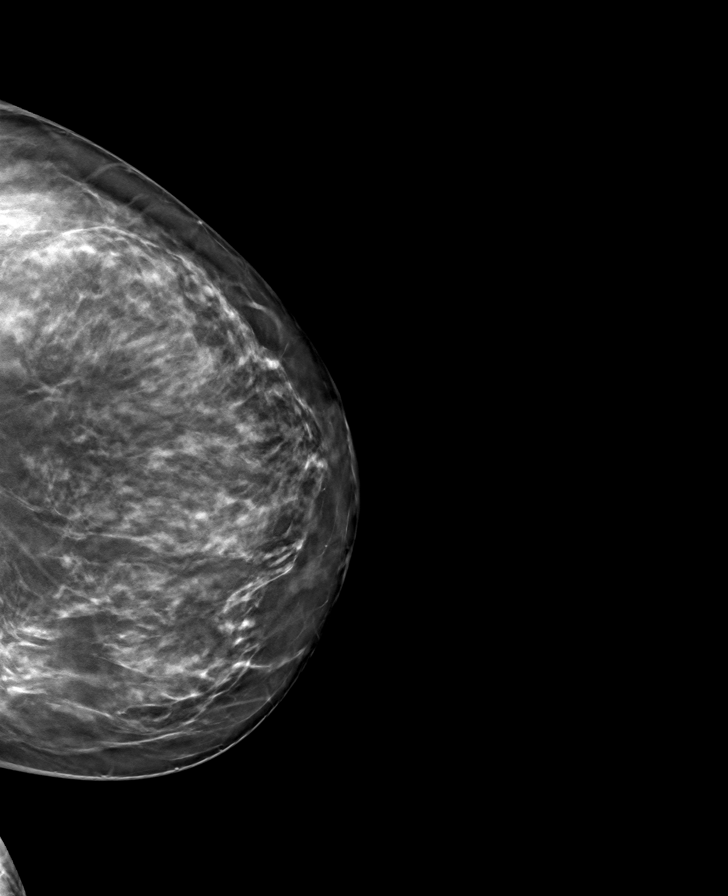

[L MLO tomo · tomo slice 45/90.0]
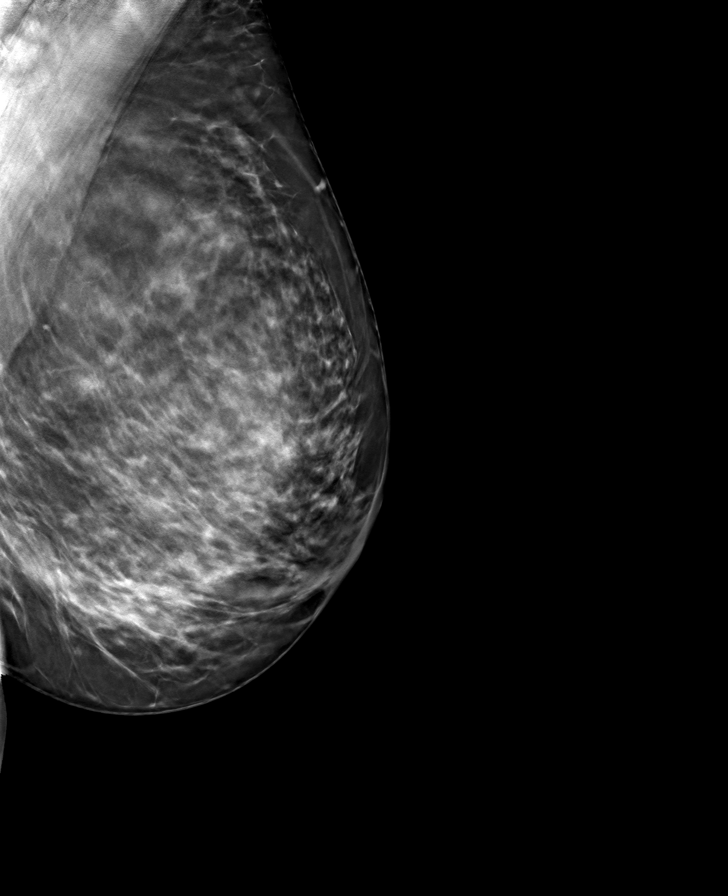

[R MLO tomo · tomo slice 49/97.0]
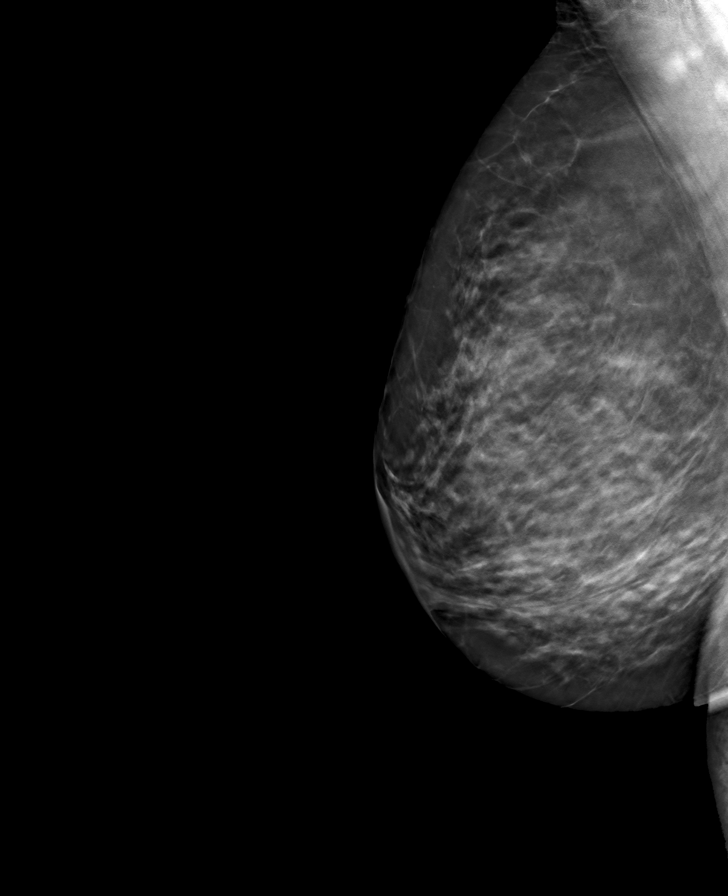

[R CC tomo · tomo slice 49/96.0]
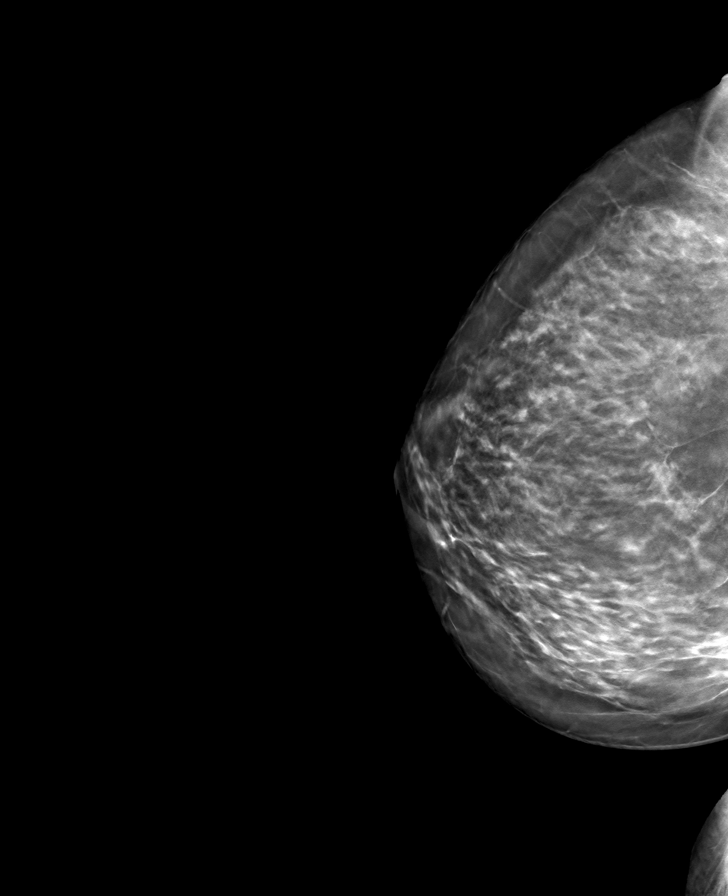

[8 of 24 positions shown; findings below may reference images not displayed]

ACR Breast Density Category c: The breast tissue is heterogeneously
dense, which may obscure small masses.
FINDINGS: There are no findings suspicious for malignancy. Images were
processed with CAD.
IMPRESSION: No mammographic evidence of malignancy. A result letter of this
screening mammogram will be mailed directly to the patient.

RECOMMENDATION:
Screening mammogram in one year. (Code:[id number]-[id number])

BI-RADS CATEGORY  1: Negative.
